# Patient Record
Sex: Female | Born: 1950 | Hispanic: No | Marital: Married | Smoking: Never smoker
Health system: Southern US, Community
[De-identification: ages and names within clinical notes are randomized; demographics above are authoritative.]

## PROBLEM LIST (undated history)

## (undated) DIAGNOSIS — D219 Benign neoplasm of connective and other soft tissue, unspecified: Secondary | ICD-10-CM

## (undated) DIAGNOSIS — I10 Essential (primary) hypertension: Secondary | ICD-10-CM

## (undated) DIAGNOSIS — C44311 Basal cell carcinoma of skin of nose: Secondary | ICD-10-CM

## (undated) DIAGNOSIS — C44711 Basal cell carcinoma of skin of unspecified lower limb, including hip: Secondary | ICD-10-CM

## (undated) DIAGNOSIS — Z8619 Personal history of other infectious and parasitic diseases: Secondary | ICD-10-CM

## (undated) HISTORY — DX: Basal cell carcinoma of skin of nose: C44.311

## (undated) HISTORY — PX: EXTERNAL FIXATION ANKLE FRACTURE: SHX1548

## (undated) HISTORY — PX: NASAL SEPTUM SURGERY: SHX37

## (undated) HISTORY — DX: Benign neoplasm of connective and other soft tissue, unspecified: D21.9

## (undated) HISTORY — PX: TUBAL LIGATION: SHX77

## (undated) HISTORY — DX: Personal history of other infectious and parasitic diseases: Z86.19

## (undated) HISTORY — DX: Essential (primary) hypertension: I10

## (undated) HISTORY — DX: Basal cell carcinoma of skin of unspecified lower limb, including hip: C44.711

## (undated) HISTORY — PX: CHOLECYSTECTOMY: SHX55

## (undated) HISTORY — PX: TONSILLECTOMY AND ADENOIDECTOMY: SUR1326

---

## 2012-08-23 ENCOUNTER — Encounter: Payer: BC Managed Care – PPO | Admitting: Obstetrics and Gynecology

## 2012-09-05 ENCOUNTER — Ambulatory Visit: Payer: BC Managed Care – PPO

## 2012-09-05 ENCOUNTER — Other Ambulatory Visit: Payer: Self-pay | Admitting: Obstetrics and Gynecology

## 2012-09-05 ENCOUNTER — Encounter: Payer: Self-pay | Admitting: Obstetrics and Gynecology

## 2012-09-05 ENCOUNTER — Ambulatory Visit: Payer: BC Managed Care – PPO | Admitting: Obstetrics and Gynecology

## 2012-09-05 VITALS — BP 142/84 | Ht 65.0 in | Wt 183.0 lb

## 2012-09-05 DIAGNOSIS — R1900 Intra-abdominal and pelvic swelling, mass and lump, unspecified site: Secondary | ICD-10-CM

## 2012-09-05 DIAGNOSIS — M810 Age-related osteoporosis without current pathological fracture: Secondary | ICD-10-CM | POA: Insufficient documentation

## 2012-09-05 DIAGNOSIS — N644 Mastodynia: Secondary | ICD-10-CM

## 2012-09-05 DIAGNOSIS — C44711 Basal cell carcinoma of skin of unspecified lower limb, including hip: Secondary | ICD-10-CM | POA: Insufficient documentation

## 2012-09-05 DIAGNOSIS — I10 Essential (primary) hypertension: Secondary | ICD-10-CM

## 2012-09-05 DIAGNOSIS — Z01419 Encounter for gynecological examination (general) (routine) without abnormal findings: Secondary | ICD-10-CM

## 2012-09-05 DIAGNOSIS — Z124 Encounter for screening for malignant neoplasm of cervix: Secondary | ICD-10-CM

## 2012-09-05 DIAGNOSIS — D219 Benign neoplasm of connective and other soft tissue, unspecified: Secondary | ICD-10-CM | POA: Insufficient documentation

## 2012-09-05 DIAGNOSIS — Z139 Encounter for screening, unspecified: Secondary | ICD-10-CM

## 2012-09-05 DIAGNOSIS — Z8619 Personal history of other infectious and parasitic diseases: Secondary | ICD-10-CM | POA: Insufficient documentation

## 2012-09-05 MED ORDER — RALOXIFENE HCL 60 MG PO TABS: 60.0000 mg | ORAL_TABLET | Freq: Every day | ORAL | Status: AC

## 2012-09-05 MED ORDER — NYSTATIN-TRIAMCINOLONE 100000-0.1 UNIT/GM-% EX OINT: TOPICAL_OINTMENT | Freq: Three times a day (TID) | CUTANEOUS | Status: DC | PRN

## 2012-09-05 MED ORDER — TRIAMCINOLONE 0.1 % CREAM:EUCERIN CREAM 1:1: 1.0000 "application " | TOPICAL_CREAM | Freq: Three times a day (TID) | CUTANEOUS | Status: AC | PRN

## 2012-09-05 NOTE — Patient Instructions (Signed)
Raloxifene tablets What is this medicine? RALOXIFENE (ral OX i feen) reduces the amount of calcium lost from bones. It is used to treat and prevent osteoporosis in women who have experienced menopause. This medicine may be used for other purposes; ask your health care provider or pharmacist if you have questions. What should I tell my health care provider before I take this medicine? They need to know if you have any of these conditions: -a history of blood clots -cancer -heart failure -liver disease -premenopausal -an unusual or allergic reaction to raloxifene, other medicines, foods, dyes, or preservatives -pregnant or trying to get pregnant -breast-feeding How should I use this medicine? Take this medicine by mouth with a glass of water. Follow the directions on the prescription label. The tablets can be taken with or without food. Take your doses at regular intervals. Do not take your medicine more often than directed. Talk to your pediatrician regarding the use of this medicine in children. Special care may be needed. Overdosage: If you think you have taken too much of this medicine contact a poison control center or emergency room at once. NOTE: This medicine is only for you. Do not share this medicine with others. What if I miss a dose? If you miss a dose, take it as soon as you can. If it is almost time for your next dose, take only that dose. Do not take double or extra doses. What may interact with this medicine? -ampicillin -cholestyramine -colestipol -diazepam -diazoxide -female hormones like hormone replacement therapy -lidocaine -warfarin This list may not describe all possible interactions. Give your health care provider a list of all the medicines, herbs, non-prescription drugs, or dietary supplements you use. Also tell them if you smoke, drink alcohol, or use illegal drugs. Some items may interact with your medicine. What should I watch for while using this  medicine? Visit your doctor or health care professional for regular checks on your progress. Do not stop taking this medicine except on the advice of your doctor or health care professional. You should make sure you get enough calcium and vitamin D in your diet while you are taking this medicine. Discuss your dietary needs with your health care professional or nutritionist. Exercise may help to prevent bone loss. Discuss your exercise needs with your doctor or health care professional. This medicine can rarely cause blood clots. You should avoid long periods of bed rest while taking this medicine. If you are going to have surgery, tell your doctor or health care professional that you are taking this medicine. This medicine should be stopped at least 3 days before surgery. After surgery, it should be restarted only after you are walking again. It should not be restarted while you still need long periods of bed rest. You should not smoke while taking this medicine. Smoking may also increase your risk of blood clots. Smoking can also decrease the effects of this medicine. This medicine does not prevent hot flashes. It may cause hot flashes in some patients at the start of therapy. What side effects may I notice from receiving this medicine? Side effects that you should report to your doctor or health care professional as soon as possible: -change in vision -chest pain -difficulty breathing -leg pain or swelling -skin rash, itching Side effects that usually do not require medical attention (report to your doctor or health care professional if they continue or are bothersome): -fluid build-up -leg cramps -stomach pain -sweating This list may not describe all possible side  effects. Call your doctor for medical advice about side effects. You may report side effects to FDA at 1-800-FDA-1088. Where should I keep my medicine? Keep out of the reach of children. Store at room temperature between 15 and 30  degrees C (59 and 86 degrees F). Throw away any unused medicine after the expiration date. NOTE: This sheet is a summary. It may not cover all possible information. If you have questions about this medicine, talk to your doctor, pharmacist, or health care provider.  2013, [employer]. (07/11/2008 3:15:14 PM)

## 2012-09-05 NOTE — Progress Notes (Signed)
The patient is not taking hormone replacement therapy The patient  is not taking a Calcium supplement. Post-menopausal bleeding:no  Last Pap: per pt 1997 Last mammogram: per pt 1990 Last DEXA scan : none Last colonoscopy:will be done 10/2012  Urinary symptoms: none Normal bowel movements: Yes Reports abuse at home: No:   Subjective:    Emily Hurley is a 62 y.o. female G2P2 who presents for annual exam.  The patient has no complaints today.   The following portions of the patient's history were reviewed and updated as appropriate: allergies, current medications, past family history, past medical history, past social history, past surgical history and problem list.  ULTRASOUND:  Uterus: Diffuse fibroid involvement Largest Measured. Fibroid 1: Left anterior fundus - pedunculated vs subserosal: 5.4 x 3.6 x 4.5 cm Fibroid 2: Right LUS - calcified: 5.4 x 4.2 x 5.2 cm Fibroid 3: posterior left - intramural: 4.6 x 4.7 x 4.3 cm Fibroid 4: mid uterus left - intramural: 3.4 x 3.3 x 3.9 cm Endometrium is not discernable. Ovaries not visualized.   Review of Systems Pertinent items are noted in HPI. Gastrointestinal:No change in bowel habits, no abdominal pain, no rectal bleeding Genitourinary:negative for dysuria, frequency, hematuria, nocturia and urinary incontinence    Objective:     BP 142/84  Ht 5\' 5"  (1.651 m)  Wt 183 lb (83.008 kg)  BMI 30.45 kg/m2  LMP 01/24/2007  Weight:  Wt Readings from Last 1 Encounters:  09/05/12 183 lb (83.008 kg)     BMI: Body mass index is 30.45 kg/(m^2). General Appearance: Alert, appropriate appearance for age. No acute distress HEENT: Grossly normal Neck / Thyroid: Supple, no masses, nodes or enlargement Lungs: clear to auscultation bilaterally Back: No CVA tenderness Breast Exam: No masses or nodes.No dimpling, nipple retraction or discharge. Cardiovascular: Regular rate and rhythm. S1, S2, no murmur Gastrointestinal: Soft, non-tender, no  masses or organomegaly Pelvic Exam: Vulva normal. Perineum with contact dermatitis.vagina appear normal. Bimanual exam reveals normal uterus and adnexa. Right enlarged ovary vs fibroid? Rectovaginal: normal rectal, no masses Lymphatic Exam: Non-palpable nodes in neck, clavicular, axillary, or inguinal regions Skin: no rash or abnormalities Neurologic: Normal gait and speech, no tremor  Psychiatric: Alert and oriented, appropriate affect.   Ultrasound:  Uterus 6.9 x 8.4 x 9.1 cm   4 measurable fibroids 4-5 cm   Normal endometrium   Ovaries not seen but no ovarian masses  Bone Density: osteoporosis with T= -2.5 at femoral neck     Assessment:     Uterine fibroids   Osteoporosis de novo    Left breast pain    Vulvitis   Plan:   pap smear return annually or prn Calcium Intake discussed (1500 mg reccommended daily)  Vaginal & Rectal itching discussed: Triamcinolone prescribed Diagnostic Mammogram @ [facility] Left breast tenderness Lower Left Quadrant Contact Dermitis discussed   U/S right Ovary location  Reviewed options for osteoporosis: HRT, Bisphophonates and Evista. R&B discussed. Pt opts for Evista trial  Silverio Lay MD

## 2012-09-07 LAB — PAP IG W/ RFLX HPV ASCU

## 2012-09-20 ENCOUNTER — Ambulatory Visit
Admission: RE | Admit: 2012-09-20 | Discharge: 2012-09-20 | Disposition: A | Discharge status: Home / Self Care | Payer: BC Managed Care – PPO | Source: Ambulatory Visit | Attending: Obstetrics and Gynecology | Admitting: Obstetrics and Gynecology

## 2012-09-20 ENCOUNTER — Other Ambulatory Visit: Payer: Self-pay | Admitting: Obstetrics and Gynecology

## 2012-09-20 DIAGNOSIS — N644 Mastodynia: Secondary | ICD-10-CM

## 2012-10-16 ENCOUNTER — Telehealth: Payer: Self-pay | Admitting: Obstetrics and Gynecology

## 2012-10-16 NOTE — Telephone Encounter (Signed)
Advised pt of normal pap results.   Darien Ramus, CMA

## 2013-02-22 ENCOUNTER — Other Ambulatory Visit: Payer: Self-pay | Admitting: Obstetrics and Gynecology

## 2013-02-22 DIAGNOSIS — N63 Unspecified lump in unspecified breast: Secondary | ICD-10-CM

## 2013-03-15 ENCOUNTER — Ambulatory Visit
Admission: RE | Admit: 2013-03-15 | Discharge: 2013-03-15 | Disposition: A | Discharge status: Home / Self Care | Payer: BC Managed Care – PPO | Source: Ambulatory Visit | Attending: Obstetrics and Gynecology | Admitting: Obstetrics and Gynecology

## 2013-03-15 DIAGNOSIS — N63 Unspecified lump in unspecified breast: Secondary | ICD-10-CM

## 2013-08-08 ENCOUNTER — Other Ambulatory Visit: Payer: Self-pay | Admitting: Obstetrics and Gynecology

## 2013-08-08 DIAGNOSIS — N6320 Unspecified lump in the left breast, unspecified quadrant: Secondary | ICD-10-CM

## 2013-09-10 ENCOUNTER — Other Ambulatory Visit: Payer: BC Managed Care – PPO

## 2013-09-18 ENCOUNTER — Ambulatory Visit
Admission: RE | Admit: 2013-09-18 | Discharge: 2013-09-18 | Disposition: A | Discharge status: Home / Self Care | Payer: BC Managed Care – PPO | Source: Ambulatory Visit | Attending: Obstetrics and Gynecology | Admitting: Obstetrics and Gynecology

## 2013-09-18 ENCOUNTER — Other Ambulatory Visit: Payer: Self-pay | Admitting: Obstetrics and Gynecology

## 2013-09-18 DIAGNOSIS — N6320 Unspecified lump in the left breast, unspecified quadrant: Secondary | ICD-10-CM

## 2013-10-25 IMAGING — US US BREAST*L*
1 series · 4 of 4 positions shown · non-contrast
Comparison: None.

CLINICAL DATA: Pain lower inner quadrant, for several weeks, now
resolving spontaneously

DIGITAL DIAGNOSTIC BILATERAL MAMMOGRAM WITH CAD AND LEFT BREAST
ULTRASOUND:

[Series 1: us breast*left* · 4 of 4 slices shown]
[im 1/4]
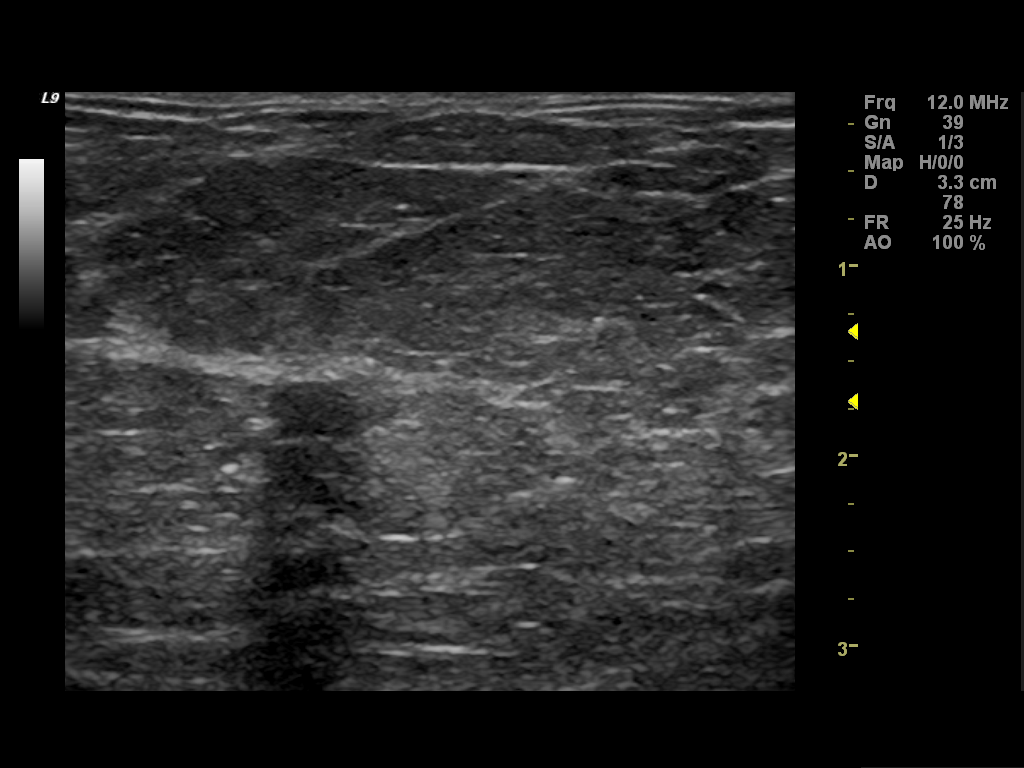
[im 2/4]
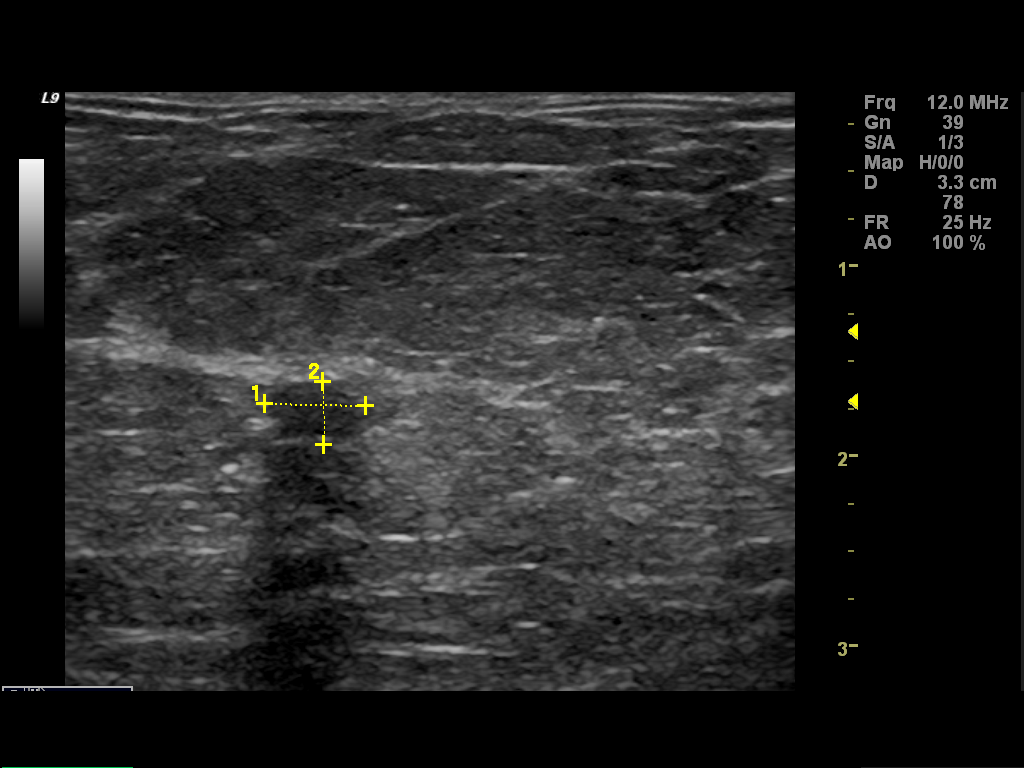
[im 3/4]
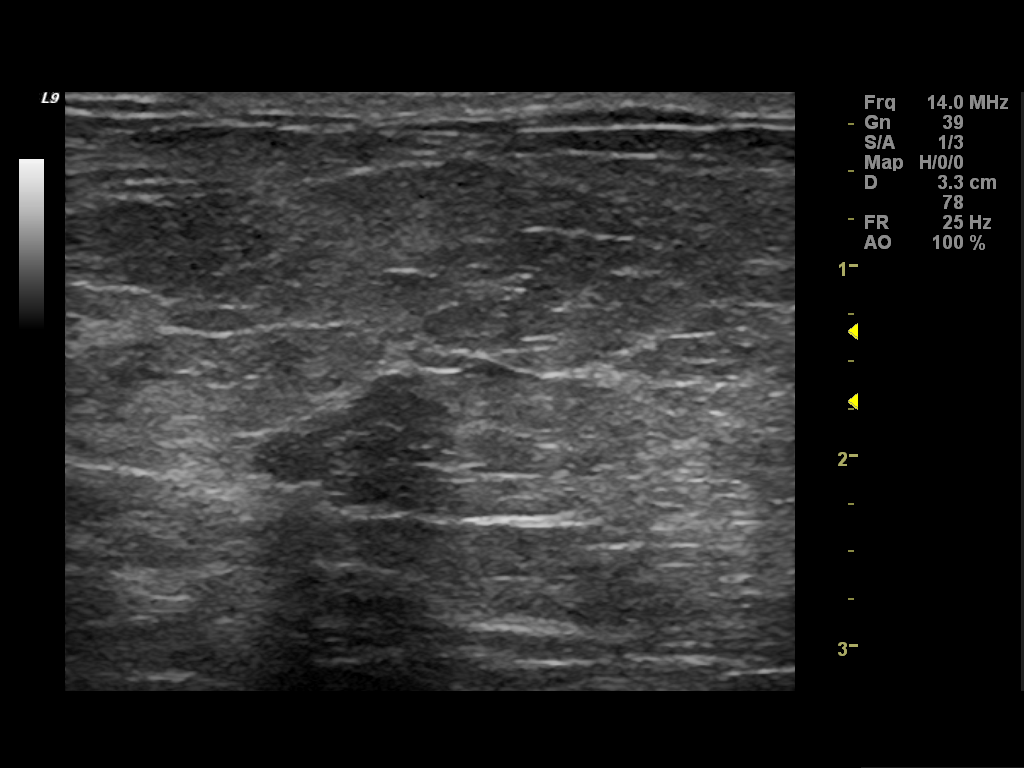
[im 4/4]
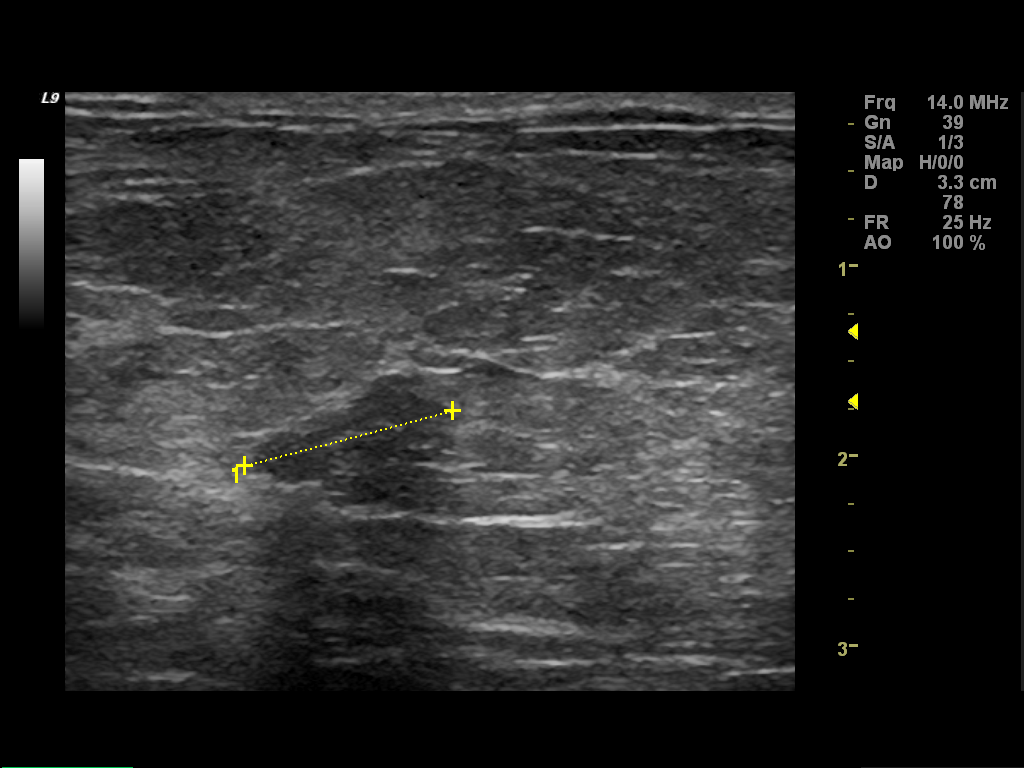

[4 of 4 positions shown; findings below may reference images not displayed]

FINDINGS: ACR Breast Density Category 2: There is a scattered fibroglandular
pattern.

Right breast shows no suspicious findings.  On the left, on the
screening views, a 7 mm retroareolar nodular density was
appreciated but this did not persist on spot compression views.
There is a normal axillary tail 5 mm intramammary lymph node.
There is a 1 cm low attenuation macrolobulated mass in the medial
left breast which appears consistent with a lymph node.

Mammographic images were processed with CAD.

On physical exam, there are no palpable abnormalities.

Ultrasound is performed, showing no abnormalities in the
retroareolar area.  In the 9 o'clock position, 6 cm from the nipple
in the deep aspect of the breast, there is a 5 x 3 x 11 mm
macrolobulated hypoechoic mass with echogenic central portion.
There is prominent edge attenuation artifact related to this.There
are no findings that would relate to the patient's complaint of
tenderness.
IMPRESSION: Incidental detection of left breast mass which is probably a normal
intramammary lymph node.

BI-RADS CATEGORY 3:  Probably benign finding(s) - short interval
follow-up suggested.

RECOMMENDATION:
Diagnostic left mammogram and ultrasound in 6 months.

I have discussed the findings and recommendations with the patient.
Results were also provided in writing at the conclusion of the
visit.

## 2014-06-10 ENCOUNTER — Encounter: Payer: Self-pay | Admitting: Obstetrics and Gynecology

## 2014-10-23 IMAGING — MG MM DIGITAL DIAGNOSTIC BILAT CAD
4 series · 4 of 4 positions shown · non-contrast
Comparison: 03/15/2013, 09/20/2012.

CLINICAL DATA: Re-evaluation of probably benign mass within the
left breast located at 9 o'clock position and annual re-evaluation
of the right breast.

EXAM:
DIGITAL DIAGNOSTIC  bilateral MAMMOGRAM WITH CAD

[R CC]
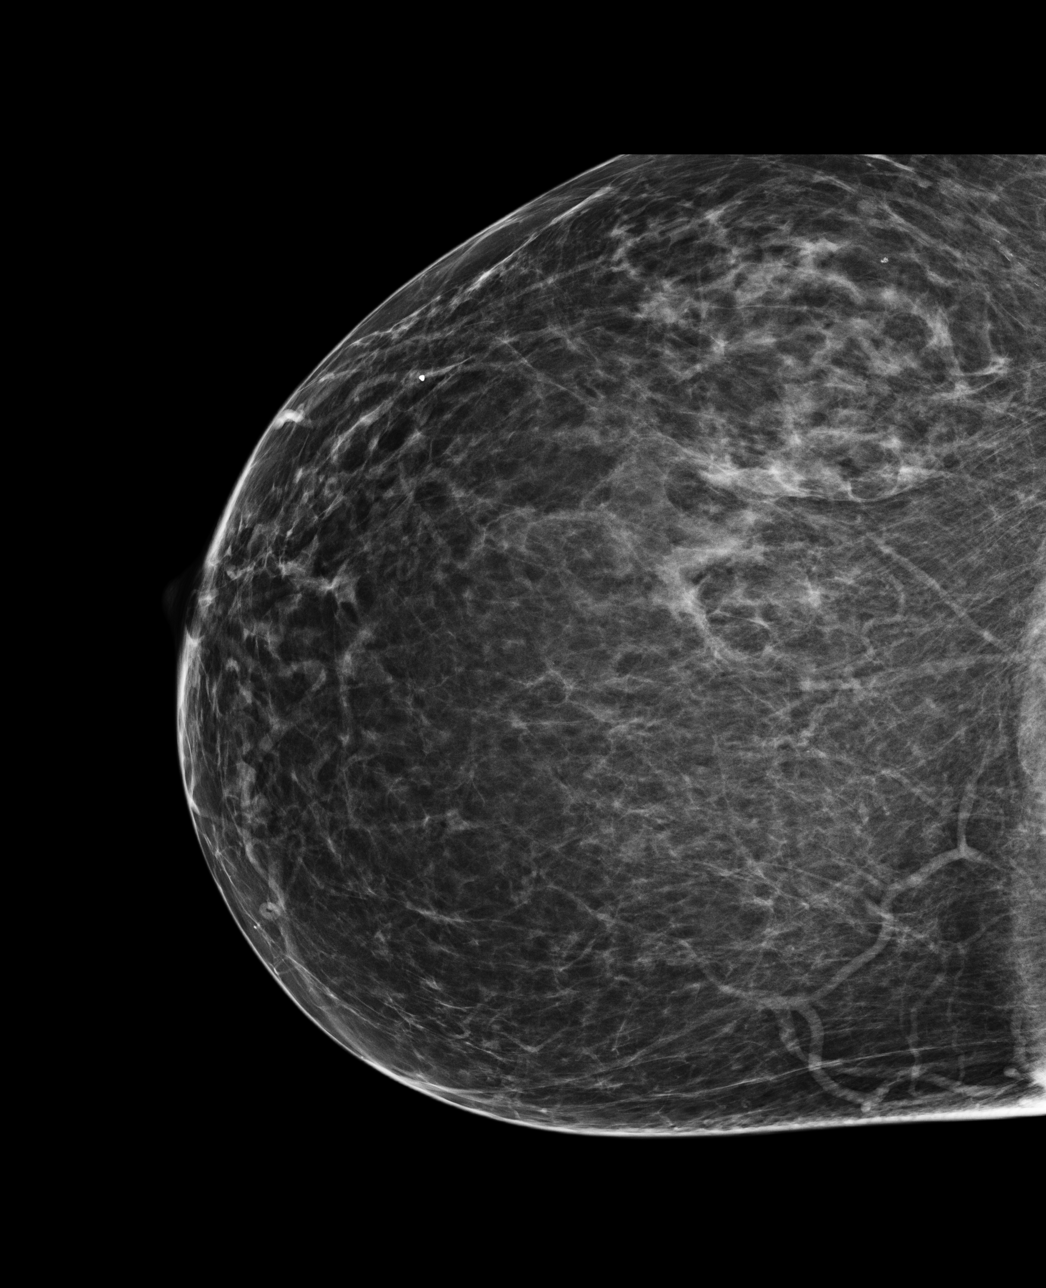

[L MLO]
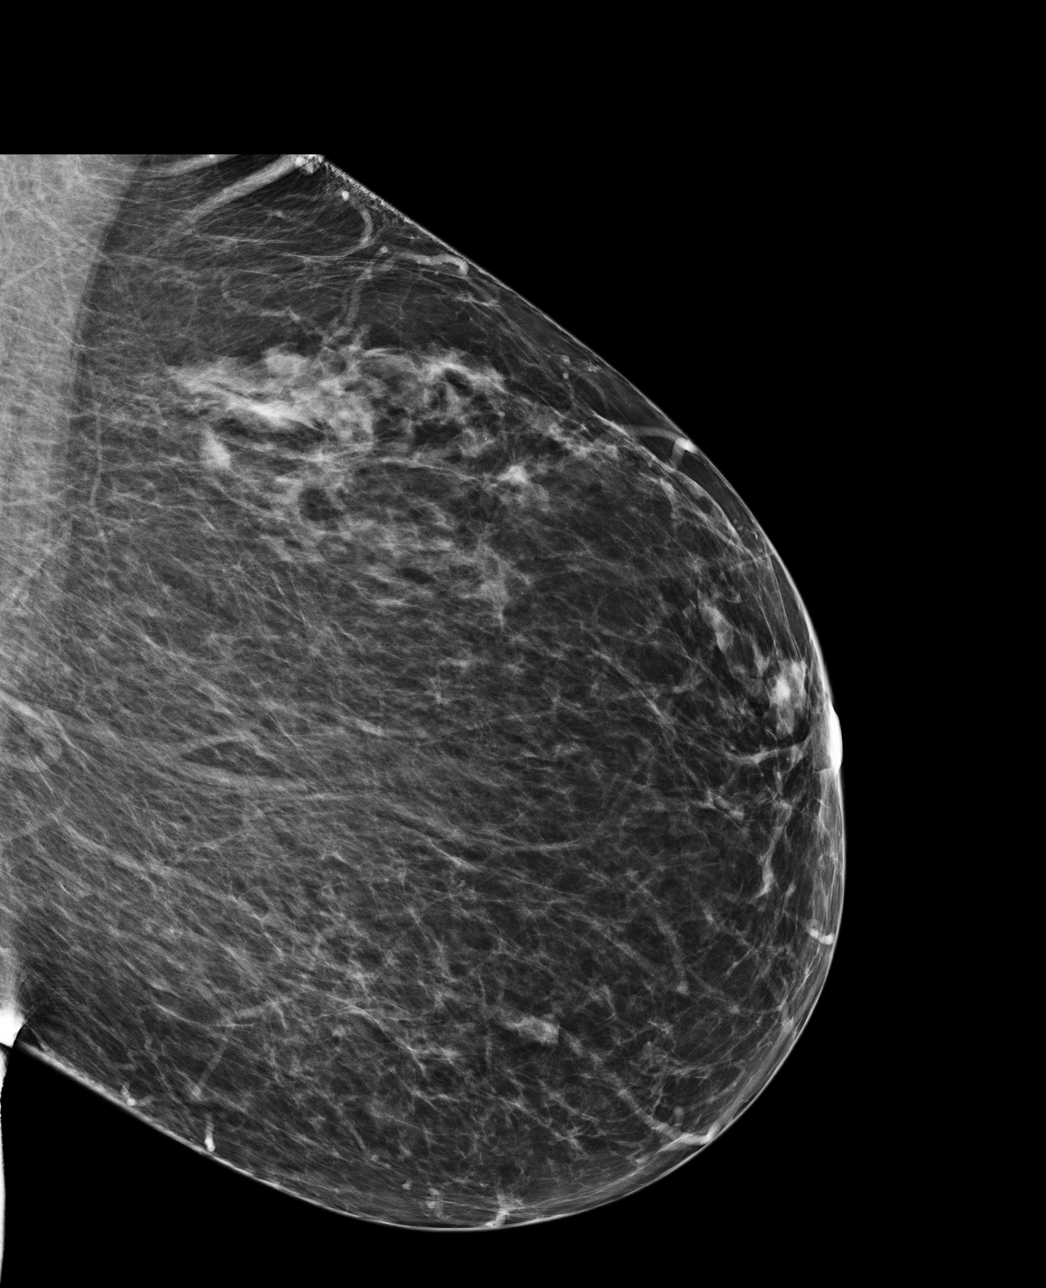

[L CC]
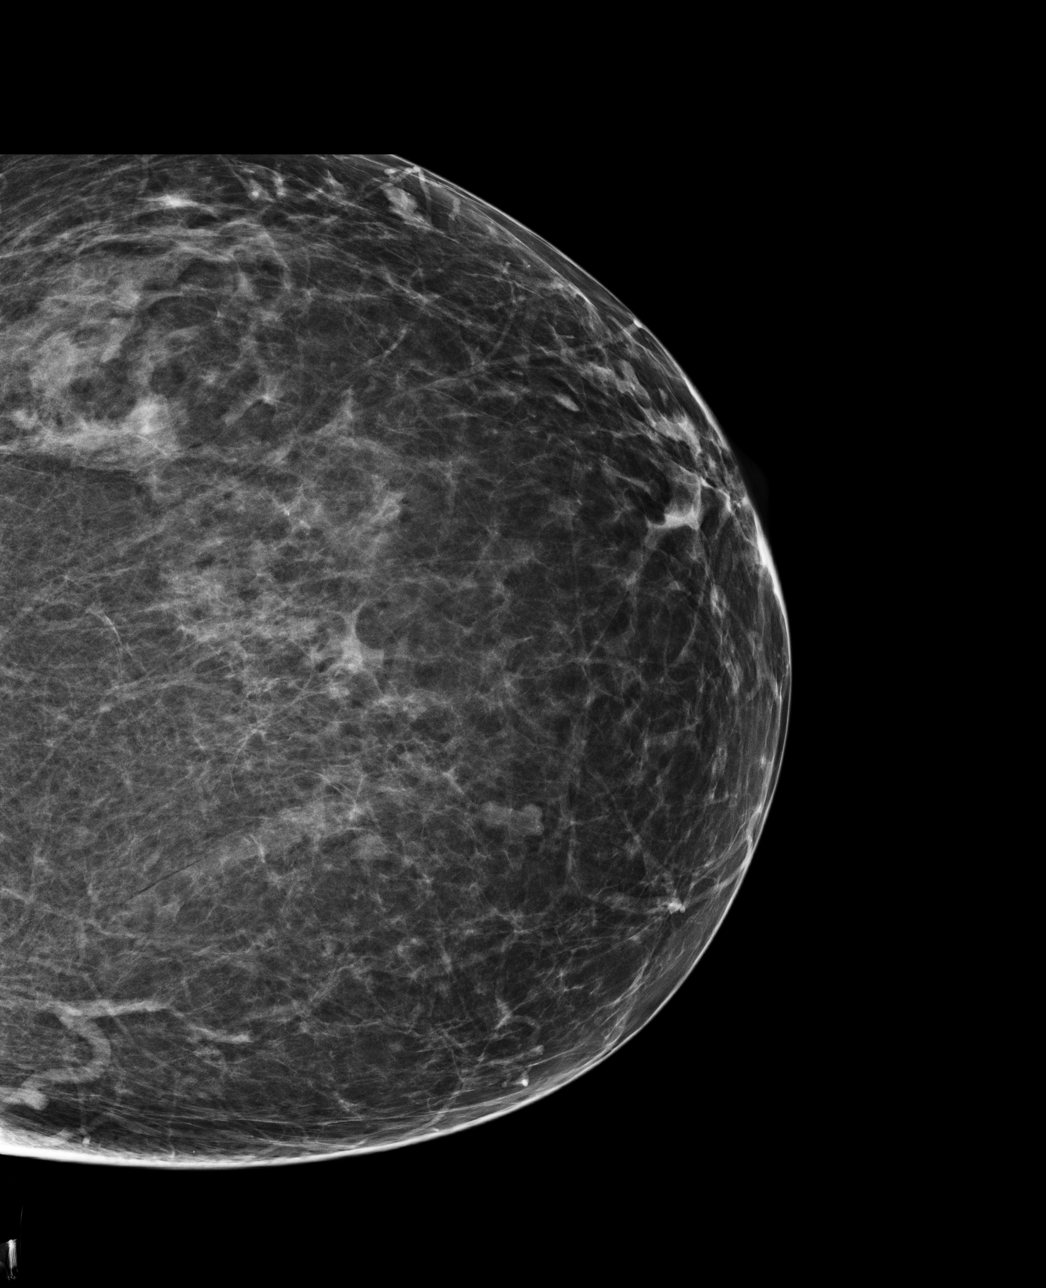

[R MLO]
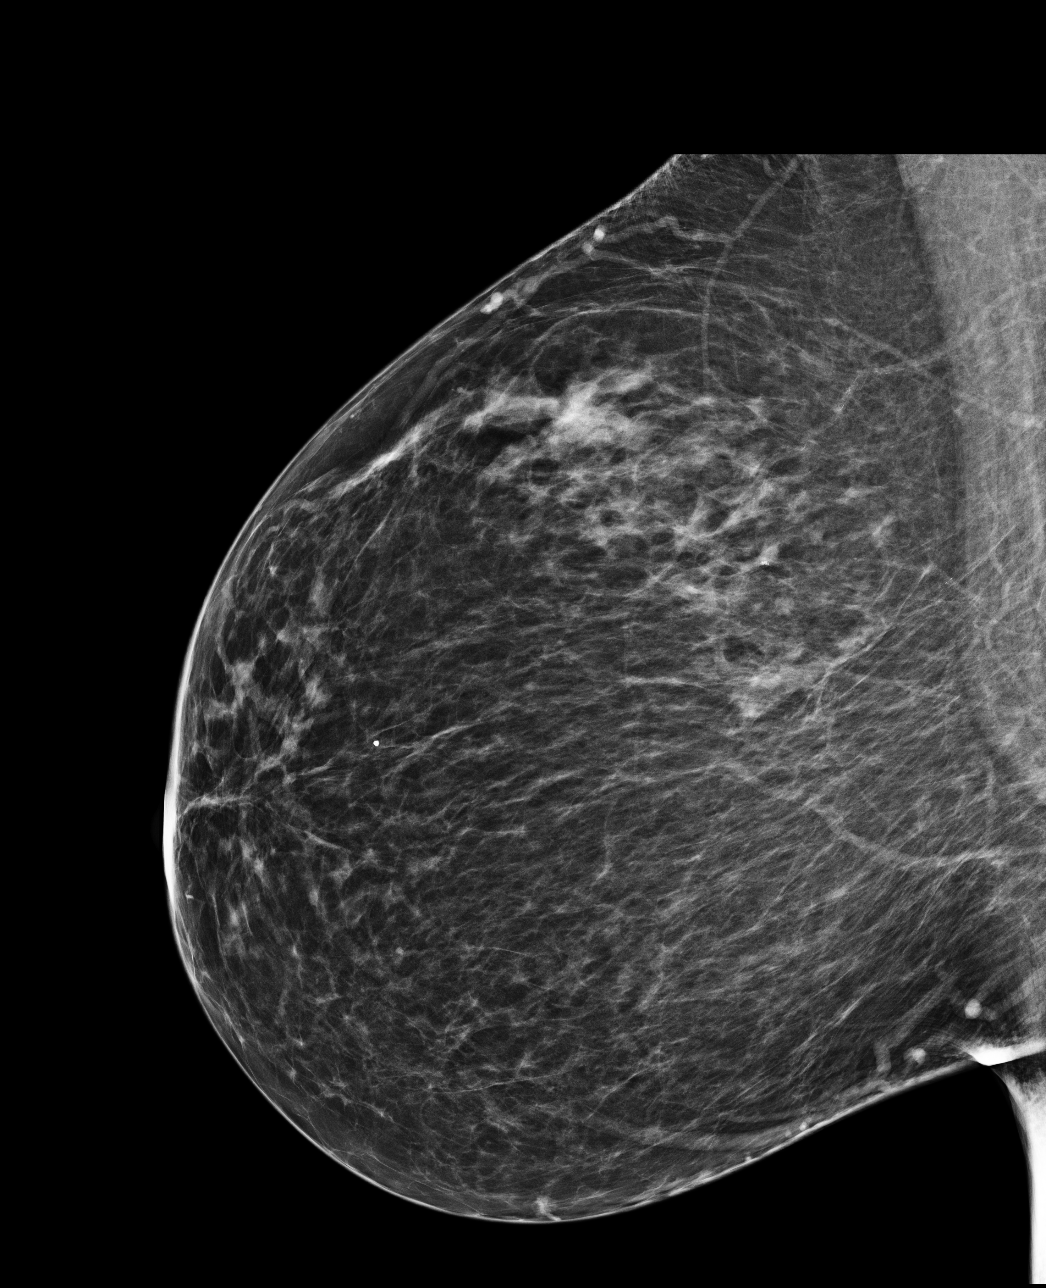

[4 of 4 positions shown; findings below may reference images not displayed]

ACR Breast Density Category b: There are scattered areas of
fibroglandular density.
FINDINGS: There is an oval, macrolobulated circumscribed nodule with central
fatty region consistent with intramammary lymph node located within
the medial left breast 9 o'clock position. This is well seen on
mammography and stable. In addition, there is a stable intramammary
lymph node located laterally within the left breast. There is no
worrisome mass, distortion, or worrisome calcification within either
breast.

Mammographic images were processed with CAD.
IMPRESSION: Stable parenchymal pattern and stable nodules within the left breast
as discussed above. Ultrasound is not felt to be needed as this area
is well visualized on the mammogram. Recommend followup bilateral
diagnostic mammography in 12 months to complete 2 years followup
evaluation.

RECOMMENDATION:
Bilateral diagnostic mammography in 12 months.

I have discussed the findings and recommendations with the patient.
Results were also provided in writing at the conclusion of the
visit. If applicable, a reminder letter will be sent to the patient
regarding the next appointment.

BI-RADS CATEGORY  3: Probably benign finding(s) - short interval
follow-up suggested.
